# Patient Record
Sex: Male | Born: 1975 | Race: White | Hispanic: No | Marital: Single | Smoking: Current every day smoker
Health system: Southern US, Community
[De-identification: ages and names within clinical notes are randomized; demographics above are authoritative.]

## PROBLEM LIST (undated history)

## (undated) DIAGNOSIS — I499 Cardiac arrhythmia, unspecified: Secondary | ICD-10-CM

## (undated) DIAGNOSIS — J189 Pneumonia, unspecified organism: Secondary | ICD-10-CM

---

## 2020-03-17 ENCOUNTER — Other Ambulatory Visit: Payer: Self-pay | Admitting: Family Medicine

## 2020-03-17 ENCOUNTER — Other Ambulatory Visit: Payer: Self-pay | Admitting: *Deleted

## 2020-03-17 DIAGNOSIS — R160 Hepatomegaly, not elsewhere classified: Secondary | ICD-10-CM

## 2020-03-20 ENCOUNTER — Ambulatory Visit
Admission: RE | Admit: 2020-03-20 | Discharge: 2020-03-20 | Disposition: A | Discharge status: Home / Self Care | Payer: Managed Care, Other (non HMO) | Source: Ambulatory Visit | Attending: Family Medicine | Admitting: Family Medicine

## 2020-03-20 ENCOUNTER — Other Ambulatory Visit: Payer: Self-pay

## 2020-03-20 DIAGNOSIS — R160 Hepatomegaly, not elsewhere classified: Secondary | ICD-10-CM | POA: Diagnosis present

## 2020-03-26 ENCOUNTER — Telehealth: Payer: Managed Care, Other (non HMO)

## 2020-04-01 ENCOUNTER — Other Ambulatory Visit: Payer: Self-pay | Admitting: Family Medicine

## 2020-04-01 DIAGNOSIS — R932 Abnormal findings on diagnostic imaging of liver and biliary tract: Secondary | ICD-10-CM

## 2020-04-07 ENCOUNTER — Ambulatory Visit
Admission: RE | Admit: 2020-04-07 | Discharge: 2020-04-07 | Disposition: A | Discharge status: Home / Self Care | Payer: Managed Care, Other (non HMO) | Source: Ambulatory Visit | Attending: Family Medicine | Admitting: Family Medicine

## 2020-04-07 ENCOUNTER — Other Ambulatory Visit: Payer: Self-pay

## 2020-04-07 DIAGNOSIS — R932 Abnormal findings on diagnostic imaging of liver and biliary tract: Secondary | ICD-10-CM | POA: Insufficient documentation

## 2021-04-07 ENCOUNTER — Telehealth: Payer: Self-pay

## 2021-04-07 ENCOUNTER — Other Ambulatory Visit (INDEPENDENT_AMBULATORY_CARE_PROVIDER_SITE_OTHER): Payer: Self-pay

## 2021-04-07 DIAGNOSIS — Z1211 Encounter for screening for malignant neoplasm of colon: Secondary | ICD-10-CM

## 2021-04-07 MED ORDER — CLENPIQ 10-3.5-12 MG-GM -GM/160ML PO SOLN: 1.0000 | ORAL | 0 refills | Status: DC

## 2021-04-07 NOTE — Progress Notes (Signed)
Gastroenterology Pre-Procedure Review  Request Date: 04/26/2021 Requesting Physician: Dr. Bonna Gains  PATIENT REVIEW QUESTIONS: The patient responded to the following health history questions as indicated:    1. Are you having any GI issues? yes (BLOATING SOFT STOOLS) 2. Do you have a personal history of Polyps? no 3. Do you have a family history of Colon Cancer or Polyps? yes (MOM AUNTS AND UNCLES ON MOMS SIDE) 4. Diabetes Mellitus? no 5. Joint replacements in the past 12 months?no 6. Major health problems in the past 3 months?no 7. Any artificial heart valves, MVP, or defibrillator?no    MEDICATIONS & ALLERGIES:    Patient reports the following regarding taking any anticoagulation/antiplatelet therapy:   Plavix, Coumadin, Eliquis, Xarelto, Lovenox, Pradaxa, Brilinta, or Effient? no Aspirin? no  Patient confirms/reports the following medications:  No current outpatient medications on file.   No current facility-administered medications for this visit.    Patient confirms/reports the following allergies:  No Known Allergies  No orders of the defined types were placed in this encounter.   AUTHORIZATION INFORMATION Primary Insurance: 1D#: Group #:  Secondary Insurance: 1D#: Group #:  SCHEDULE INFORMATION: Date: 04/26/2021 Time: Location:

## 2021-04-07 NOTE — Telephone Encounter (Signed)
CALLED NO ANSWER LEFT VOICEMAIL FOR A CALL BACK 

## 2021-04-08 ENCOUNTER — Other Ambulatory Visit: Payer: Self-pay

## 2021-04-08 MED ORDER — CLENPIQ 10-3.5-12 MG-GM -GM/160ML PO SOLN: 1.0000 | ORAL | 0 refills | Status: DC

## 2021-04-08 NOTE — Progress Notes (Signed)
Patient called to reschedule coloscopy till 05/10/2021 at [facility] and with vanga

## 2021-05-10 ENCOUNTER — Encounter: Payer: Self-pay | Admitting: Anesthesiology

## 2021-05-10 ENCOUNTER — Encounter
Admission: RE | Disposition: A | Discharge status: Home / Self Care | Payer: Self-pay | Source: Home / Self Care | Attending: Gastroenterology

## 2021-05-10 ENCOUNTER — Ambulatory Visit
Admission: RE | Admit: 2021-05-10 | Discharge: 2021-05-10 | Disposition: A | Discharge status: Home / Self Care | Payer: 59 | Attending: Gastroenterology | Admitting: Gastroenterology

## 2021-05-10 ENCOUNTER — Encounter: Payer: Self-pay | Admitting: Gastroenterology

## 2021-05-10 ENCOUNTER — Other Ambulatory Visit: Payer: Self-pay

## 2021-05-10 DIAGNOSIS — K635 Polyp of colon: Secondary | ICD-10-CM | POA: Diagnosis not present

## 2021-05-10 DIAGNOSIS — Z79899 Other long term (current) drug therapy: Secondary | ICD-10-CM | POA: Insufficient documentation

## 2021-05-10 DIAGNOSIS — D3610 Benign neoplasm of peripheral nerves and autonomic nervous system, unspecified: Secondary | ICD-10-CM | POA: Diagnosis not present

## 2021-05-10 DIAGNOSIS — F17210 Nicotine dependence, cigarettes, uncomplicated: Secondary | ICD-10-CM | POA: Insufficient documentation

## 2021-05-10 DIAGNOSIS — D126 Benign neoplasm of colon, unspecified: Secondary | ICD-10-CM | POA: Diagnosis not present

## 2021-05-10 DIAGNOSIS — Z1211 Encounter for screening for malignant neoplasm of colon: Secondary | ICD-10-CM

## 2021-05-10 DIAGNOSIS — Z7901 Long term (current) use of anticoagulants: Secondary | ICD-10-CM | POA: Diagnosis not present

## 2021-05-10 DIAGNOSIS — K621 Rectal polyp: Secondary | ICD-10-CM | POA: Diagnosis not present

## 2021-05-10 HISTORY — DX: Cardiac arrhythmia, unspecified: I49.9

## 2021-05-10 HISTORY — PX: COLONOSCOPY WITH PROPOFOL: SHX5780

## 2021-05-10 HISTORY — DX: Pneumonia, unspecified organism: J18.9

## 2021-05-10 SURGERY — COLONOSCOPY WITH PROPOFOL
Anesthesia: General

## 2021-05-10 MED ORDER — LIDOCAINE HCL (CARDIAC) PF 100 MG/5ML IV SOSY
PREFILLED_SYRINGE | INTRAVENOUS | Status: DC | PRN
  Administered 2021-05-10: 50 mg via INTRAVENOUS

## 2021-05-10 MED ORDER — PROPOFOL 500 MG/50ML IV EMUL
INTRAVENOUS | Status: DC | PRN
  Administered 2021-05-10: 175 ug/kg/min via INTRAVENOUS

## 2021-05-10 MED ORDER — PHENYLEPHRINE HCL (PRESSORS) 10 MG/ML IV SOLN
INTRAVENOUS | Status: AC
  Filled 2021-05-10: qty 1

## 2021-05-10 MED ORDER — DEXMEDETOMIDINE (PRECEDEX) IN NS 20 MCG/5ML (4 MCG/ML) IV SYRINGE
PREFILLED_SYRINGE | INTRAVENOUS | Status: DC | PRN
  Administered 2021-05-10: 12 ug via INTRAVENOUS

## 2021-05-10 MED ORDER — PROPOFOL 10 MG/ML IV BOLUS
INTRAVENOUS | Status: DC | PRN
  Administered 2021-05-10: 90 mg via INTRAVENOUS
  Administered 2021-05-10: 50 mg via INTRAVENOUS

## 2021-05-10 MED ORDER — LIDOCAINE HCL (PF) 2 % IJ SOLN
INTRAMUSCULAR | Status: AC
  Filled 2021-05-10: qty 5

## 2021-05-10 MED ORDER — DEXMEDETOMIDINE HCL IN NACL 200 MCG/50ML IV SOLN
INTRAVENOUS | Status: AC
  Filled 2021-05-10: qty 50

## 2021-05-10 MED ORDER — MIDAZOLAM HCL 2 MG/2ML IJ SOLN
INTRAMUSCULAR | Status: DC | PRN
  Administered 2021-05-10: 1 mg via INTRAVENOUS

## 2021-05-10 MED ORDER — PROPOFOL 500 MG/50ML IV EMUL
INTRAVENOUS | Status: AC
  Filled 2021-05-10: qty 50

## 2021-05-10 MED ORDER — METOPROLOL TARTRATE 5 MG/5ML IV SOLN
INTRAVENOUS | Status: AC
  Filled 2021-05-10: qty 5

## 2021-05-10 MED ORDER — SODIUM CHLORIDE 0.9 % IV SOLN: INTRAVENOUS | Status: DC

## 2021-05-10 MED ORDER — MIDAZOLAM HCL 2 MG/2ML IJ SOLN
INTRAMUSCULAR | Status: AC
  Filled 2021-05-10: qty 2

## 2021-05-10 MED ORDER — METOPROLOL TARTRATE 5 MG/5ML IV SOLN
INTRAVENOUS | Status: DC | PRN
  Administered 2021-05-10: 2 mg via INTRAVENOUS

## 2021-05-10 MED ORDER — PHENYLEPHRINE HCL (PRESSORS) 10 MG/ML IV SOLN
INTRAVENOUS | Status: DC | PRN
Start: 2021-05-10 — End: 2021-05-10
  Administered 2021-05-10: 200 ug via INTRAVENOUS
  Administered 2021-05-10: 100 ug via INTRAVENOUS
  Administered 2021-05-10: 200 ug via INTRAVENOUS

## 2021-05-10 NOTE — Op Note (Signed)
[facility] Gastroenterology Patient Name: John Newman Procedure Date: 05/10/2021 7:47 AM MRN: [id number] Account #: [id number] Date of Birth: 09/04/1975 Admit Type: Outpatient Age: 45 Room: [facility] ENDO ROOM 1 Gender: Male Note Status: Finalized Instrument Name: Park [id number] [id number] Procedure:             Colonoscopy Indications:           Screening for colorectal malignant neoplasm, This is                         the patient's first colonoscopy Providers:             Lin Landsman MD, MD Medicines:             General Anesthesia Complications:         No immediate complications. Estimated blood loss: None. Procedure:             Pre-Anesthesia Assessment:                        - Prior to the procedure, a History and Physical was                         performed, and patient medications and allergies were                         reviewed. The patient is competent. The risks and                         benefits of the procedure and the sedation options and                         risks were discussed with the patient. All questions                         were answered and informed consent was obtained.                         Patient identification and proposed procedure were                         verified by the physician, the nurse, the                         anesthesiologist, the anesthetist and the technician                         in the pre-procedure area in the procedure room in the                         endoscopy suite. Mental Status Examination: alert and                         oriented. Airway Examination: normal oropharyngeal                         airway and neck mobility. Respiratory Examination:                         clear to auscultation.  CV Examination: normal.                         Prophylactic Antibiotics: The patient does not require                         prophylactic antibiotics. Prior Anticoagulants: The                          patient has taken Eliquis (apixaban), last dose was 2                         days prior to procedure. ASA Grade Assessment: III - A                         patient with severe systemic disease. After reviewing                         the risks and benefits, the patient was deemed in                         satisfactory condition to undergo the procedure. The                         anesthesia plan was to use general anesthesia.                         Immediately prior to administration of medications,                         the patient was re-assessed for adequacy to receive                         sedatives. The heart rate, respiratory rate, oxygen                         saturations, blood pressure, adequacy of pulmonary                         ventilation, and response to care were monitored                         throughout the procedure. The physical status of the                         patient was re-assessed after the procedure.                        After obtaining informed consent, the colonoscope was                         passed under direct vision. Throughout the procedure,                         the patient's blood pressure, pulse, and oxygen                         saturations were monitored continuously. The  Colonoscope was introduced through the anus and                         advanced to the the cecum, identified by appendiceal                         orifice and ileocecal valve. The colonoscopy was                         performed with moderate difficulty due to significant                         looping and the patient's body habitus. Successful                         completion of the procedure was aided by applying                         abdominal pressure. The patient tolerated the                         procedure well. The quality of the bowel preparation                         was evaluated using the BBPS [facility] Bowel  Preparation                         Scale) with scores of: Right Colon = 3, Transverse                         Colon = 3 and Left Colon = 3 (entire mucosa seen well                         with no residual staining, small fragments of stool or                         opaque liquid). The total BBPS score equals 9. Findings:      The perianal and digital rectal examinations were normal. Pertinent       negatives include normal sphincter tone and no palpable rectal lesions.      A 10 mm polyp was found in the ascending colon. The polyp was sessile.       The polyp was removed with a hot snare. Resection and retrieval were       complete.      A 5 mm polyp was found in the transverse colon. The polyp was sessile.       The polyp was removed with a cold snare. Resection and retrieval were       complete.      Three sessile polyps were found in the rectum. The polyps were 3 to 5 mm       in size. These polyps were removed with a cold snare. Resection and       retrieval were complete.      The retroflexed view of the distal rectum and anal verge was normal and       showed no anal or rectal abnormalities. Impression:            -  One 10 mm polyp in the ascending colon, removed with                         a hot snare. Resected and retrieved.                        - One 5 mm polyp in the transverse colon, removed with                         a cold snare. Resected and retrieved.                        - Three 3 to 5 mm polyps in the rectum, removed with a                         cold snare. Resected and retrieved.                        - The distal rectum and anal verge are normal on                         retroflexion view. Recommendation:        - Discharge patient to home (with escort).                        - Resume previous diet today.                        - Continue present medications.                        - Await pathology results.                        - Repeat colonoscopy  in 5 years for surveillance.                        - Resume Eliquis (apixaban) in 2 days at prior dose.                         Refer to managing physician for further adjustment of                         therapy. Procedure Code(s):     --- Professional ---                        [phone], Colonoscopy, flexible; with removal of                         tumor(s), polyp(s), or other lesion(s) by snare                         technique Diagnosis Code(s):     --- Professional ---                        Z12.11, Encounter for screening for malignant neoplasm  of colon                        K63.5, Polyp of colon                        K62.1, Rectal polyp CPT copyright 2019 American Medical Association. All rights reserved. The codes documented in this report are preliminary and upon coder review may  be revised to meet current compliance requirements. Dr. Ulyess Mort Lin Landsman MD, MD 05/10/2021 8:36:06 AM This report has been signed electronically. Number of Addenda: 0 Note Initiated On: 05/10/2021 7:47 AM Scope Withdrawal Time: 0 hours 31 minutes 0 seconds  Total Procedure Duration: 0 hours 38 minutes 39 seconds  Estimated Blood Loss:  Estimated blood loss: none.      [facility]

## 2021-05-10 NOTE — Anesthesia Procedure Notes (Signed)
Date/Time: 05/10/2021 7:48 AM Performed by: Johnna Acosta, CRNA Pre-anesthesia Checklist: Patient identified, Emergency Drugs available, Suction available, Patient being monitored and Timeout performed Patient Re-evaluated:Patient Re-evaluated prior to induction Oxygen Delivery Method: Nasal cannula Preoxygenation: Pre-oxygenation with 100% oxygen Induction Type: IV induction

## 2021-05-10 NOTE — H&P (Signed)
Cephas Darby, MD [address]  [place]  [place], [place] [address]  Main: [phone]  Fax: [phone] Pager: [phone]  Primary Care Physician:  Sofie Hartigan, MD Primary Gastroenterologist:  Dr. Cephas Darby  Pre-Procedure History & Physical: HPI:  John Newman is a 45 y.o. male is here for an colonoscopy.   Past Medical History:  Diagnosis Date   Dysrhythmia    Pneumonia     History reviewed. No pertinent surgical history.  Prior to Admission medications   Medication Sig Start Date End Date Taking? Authorizing Provider  apixaban (ELIQUIS) 5 MG TABS tablet Take 5 mg by mouth 2 (two) times daily.   Yes [provider]  benzonatate (TESSALON) 100 MG capsule Take 100 mg by mouth 3 (three) times daily as needed for cough.   Yes [provider]  cefUROXime (CEFTIN) 500 MG tablet Take 500 mg by mouth 2 (two) times daily with a meal.   Yes [provider]  metoprolol succinate (TOPROL-XL) 25 MG 24 hr tablet Take 25 mg by mouth daily.   Yes [provider]  Sod Picosulfate-Mag Ox-Cit Acd (CLENPIQ) 10-3.5-12 MG-GM -GM/160ML SOLN Take 1 kit by mouth as directed. At 5 PM evening before procedure, drink 1 bottle of Clenpiq, hydrate, drink (5) 8 oz of water. Then do the same thing 5 hours prior to your procedure. 04/07/21   Virgel Manifold, MD  Sod Picosulfate-Mag Ox-Cit Acd (CLENPIQ) 10-3.5-12 MG-GM -GM/160ML SOLN Take 1 kit by mouth as directed. At 5 PM evening before procedure, drink 1 bottle of Clenpiq, hydrate, drink (5) 8 oz of water. Then do the same thing 5 hours prior to your procedure. 04/08/21   Lin Landsman, MD    Allergies as of 04/07/2021   (No Known Allergies)    History reviewed. No pertinent family history.  Social History   Socioeconomic History   Marital status: Single    Spouse name: Not on file   Number of children: Not on file   Years of education: Not on file   Highest education level: Not on  file  Occupational History   Not on file  Tobacco Use   Smoking status: Every Day    Packs/day: 1.50    Types: Cigarettes   Smokeless tobacco: Never  Vaping Use   Vaping Use: Never used  Substance and Sexual Activity   Alcohol use: Yes    Alcohol/week: 10.0 standard drinks    Types: 10 Shots of liquor per week    Comment: Nightly   Drug use: Not Currently    Types: Marijuana   Sexual activity: Not on file  Other Topics Concern   Not on file  Social History Narrative   Not on file   Social Determinants of Health   Financial Resource Strain: Not on file  Food Insecurity: Not on file  Transportation Needs: Not on file  Physical Activity: Not on file  Stress: Not on file  Social Connections: Not on file  Intimate Partner Violence: Not on file    Review of Systems: See HPI, otherwise negative ROS  Physical Exam: BP (!) 141/109   Pulse 100   Temp 98.4 F (36.9 C) (Temporal)   Resp 17   Ht '5\' 11"'  (1.803 m)   Wt (!) 139.7 kg   SpO2 99%   BMI 42.96 kg/m  General:   Alert,  pleasant and cooperative in NAD Head:  Normocephalic and atraumatic. Neck:  Supple; no masses or thyromegaly.  Lungs:  Clear throughout to auscultation.    Heart:  Regular rate and rhythm. Abdomen:  Soft, nontender and nondistended. Normal bowel sounds, without guarding, and without rebound.   Neurologic:  Alert and  oriented x4;  grossly normal neurologically.  Impression/Plan: Fabrizio Filip is here for an colonoscopy to be performed for colon cancer screening  Risks, benefits, limitations, and alternatives regarding  colonoscopy have been reviewed with the patient.  Questions have been answered.  All parties agreeable.   Sherri Sear, MD  05/10/2021, 7:40 AM

## 2021-05-10 NOTE — Anesthesia Preprocedure Evaluation (Signed)
Anesthesia Evaluation  Patient identified by MRN, date of birth, ID band Patient awake    Reviewed: Allergy & Precautions, H&P , NPO status , Patient's Chart, lab work & pertinent test results, reviewed documented beta blocker date and time   Airway Mallampati: II   Neck ROM: full    Dental  (+) Teeth Intact   Pulmonary pneumonia, unresolved, Current Smoker,    Pulmonary exam normal        Cardiovascular Exercise Tolerance: Good + dysrhythmias Atrial Fibrillation  Rhythm:regular Rate:Normal     Neuro/Psych negative neurological ROS  negative psych ROS   GI/Hepatic negative GI ROS, Neg liver ROS,   Endo/Other  negative endocrine ROS  Renal/GU negative Renal ROS  negative genitourinary   Musculoskeletal   Abdominal   Peds  Hematology negative hematology ROS (+)   Anesthesia Other Findings Past Medical History: No date: Dysrhythmia No date: Pneumonia History reviewed. No pertinent surgical history.   Reproductive/Obstetrics negative OB ROS                             Anesthesia Physical Anesthesia Plan  ASA: 3  Anesthesia Plan: General   Post-op Pain Management:    Induction:   PONV Risk Score and Plan:   Airway Management Planned:   Additional Equipment:   Intra-op Plan:   Post-operative Plan:   Informed Consent: I have reviewed the patients History and Physical, chart, labs and discussed the procedure including the risks, benefits and alternatives for the proposed anesthesia with the patient or authorized representative who has indicated his/her understanding and acceptance.     Dental Advisory Given  Plan Discussed with: CRNA  Anesthesia Plan Comments:         Anesthesia Quick Evaluation

## 2021-05-10 NOTE — Anesthesia Postprocedure Evaluation (Signed)
Anesthesia Post Note  Patient: John Newman  Procedure(s) Performed: COLONOSCOPY WITH PROPOFOL  Patient location during evaluation: PACU Anesthesia Type: General Level of consciousness: awake and alert Pain management: pain level controlled Vital Signs Assessment: post-procedure vital signs reviewed and stable Respiratory status: spontaneous breathing, nonlabored ventilation, respiratory function stable and patient connected to nasal cannula oxygen Cardiovascular status: blood pressure returned to baseline and stable Postop Assessment: no apparent nausea or vomiting Anesthetic complications: no   No notable events documented.   Last Vitals:  Vitals:   05/10/21 0843 05/10/21 0851  BP: 94/79 107/80  Pulse: (!) 103 94  Resp: 12 19  Temp:    SpO2: 96% 96%    Last Pain:  Vitals:   05/10/21 0851  TempSrc:   PainSc: 0-No pain                 Molli Barrows

## 2021-05-10 NOTE — Transfer of Care (Signed)
Immediate Anesthesia Transfer of Care Note  Patient: John Newman  Procedure(s) Performed: COLONOSCOPY WITH PROPOFOL  Patient Location: PACU  Anesthesia Type:General  Level of Consciousness: awake, drowsy and patient cooperative  Airway & Oxygen Therapy: Patient Spontanous Breathing and Patient connected to nasal cannula oxygen  Post-op Assessment: Report given to RN and Post -op Vital signs reviewed and stable  Post vital signs: Reviewed and stable  Last Vitals:  Vitals Value Taken Time  BP 94/79 05/10/21 0843  Temp 36 C 05/10/21 0841  Pulse 103 05/10/21 0843  Resp 12 05/10/21 0843  SpO2 96 % 05/10/21 0843    Last Pain:  Vitals:   05/10/21 0841  TempSrc: Tympanic  PainSc: Asleep         Complications: No notable events documented.

## 2021-05-11 ENCOUNTER — Encounter: Payer: Self-pay | Admitting: Gastroenterology

## 2021-05-12 ENCOUNTER — Encounter: Payer: Self-pay | Admitting: Gastroenterology

## 2021-05-12 LAB — SURGICAL PATHOLOGY

## 2021-05-19 ENCOUNTER — Telehealth: Payer: Self-pay

## 2021-05-19 DIAGNOSIS — K625 Hemorrhage of anus and rectum: Secondary | ICD-10-CM

## 2021-05-19 LAB — CBC
Hematocrit: 43 % (ref 37.5–51.0)
Hemoglobin: 15 g/dL (ref 13.0–17.7)
MCH: 32.5 pg (ref 26.6–33.0)
MCHC: 34.9 g/dL (ref 31.5–35.7)
MCV: 93 fL (ref 79–97)
Platelets: 288 10*3/uL (ref 150–450)
RBC: 4.62 x10E6/uL (ref 4.14–5.80)
RDW: 12.1 % (ref 11.6–15.4)
WBC: 12.3 10*3/uL — ABNORMAL HIGH (ref 3.4–10.8)

## 2021-05-19 NOTE — Telephone Encounter (Signed)
-----   Message from Lin Landsman, MD sent at 05/19/2021  2:52 PM EDT ----- Caryl Pina  Please inform patient that his hemoglobin is still normal at 15, but dropped from his baseline which was 16.5 in August.  I would like him to stay on clear liquid diet today, and go ahead and schedule colonoscopy tomorrow if he is agreeable.  I am concerned about bleeding from the polypectomy site in his right colon.  If you would like to give next 24 hours, take MiraLAX twice daily and see if his stools clear up, I am okay with that too

## 2021-05-19 NOTE — Telephone Encounter (Signed)
Patient left a voicemail stating the had a colonoscopy on 05/10/2021. He states he was told if he began to have rectal bleeding to call back. He states he been having rectal bleeding. Called and left a message for call back to get more information

## 2021-05-19 NOTE — Telephone Encounter (Signed)
Please ask him not to take eliquis, and let's check CBC today. If his Hb is low, he will need colonoscopy  RV

## 2021-05-19 NOTE — Telephone Encounter (Signed)
He states he had rectal bleeding on 05/10/2021 after his procedure but it stopped the next day. He states he began to have rectal bleeding yesterday. He states he had 5 to 6 bowel movements yesterday all with rectal bleeding. He states that the bleeding was in the toilet and on the toilet paper.  He states the stools are black and the blood is darker red. He is still taking his Eliquis. Patient states on Monday she had a big bowel movement and then after that he began to have diarrhea on Tuesday.

## 2021-05-19 NOTE — Telephone Encounter (Signed)
Patient has not taken Eliquis today yet. He states he will not take it today. He will come to our office today to have the labs done as soon as he can

## 2021-05-19 NOTE — Telephone Encounter (Signed)
Patient verbalized understanding of results. He states he can not do tomorrow because he knows he does not have a ride for tomorrow. He is going to call his brother today and find out if he can take him on Friday. He is not going to schedule anything at this time he going to see how he is in the morning. He will call us first thing in the morning to let us know what he wants to do. He said he might have to wait till next week due to transportation.

## 2021-05-19 NOTE — Addendum Note (Signed)
Addended by: Ulyess Blossom L on: 05/19/2021 08:57 AM   Modules accepted: Orders

## 2021-05-20 NOTE — Telephone Encounter (Signed)
Patient's should restart taking Eliquis after his stool completely turns brown  RV

## 2021-05-20 NOTE — Telephone Encounter (Signed)
Patient states he does not have any rectal bleeding today. He states he does not want to have colonoscopy now since it has improved. Patient asked when he should start taking the Eliquis again

## 2021-05-20 NOTE — Telephone Encounter (Signed)
Patient states that he just woke up and so far he has had no rectal bleeding. He states he just have some abdominal bloating. He states he is going to let us know early afternoon if bleeding begins. He states he is going to keep on clear liquids today incase he has to have a colonoscopy tomorrow.

## 2021-05-21 ENCOUNTER — Other Ambulatory Visit: Payer: Self-pay

## 2021-05-21 DIAGNOSIS — K625 Hemorrhage of anus and rectum: Secondary | ICD-10-CM

## 2021-05-21 MED ORDER — NA SULFATE-K SULFATE-MG SULF 17.5-3.13-1.6 GM/177ML PO SOLN: 354.0000 mL | Freq: Once | ORAL | 0 refills | Status: AC

## 2021-05-21 NOTE — Addendum Note (Signed)
Addended by: Ulyess Blossom L on: 05/21/2021 09:36 AM   Modules accepted: Orders

## 2021-05-21 NOTE — Addendum Note (Signed)
Addended by: Ulyess Blossom L on: 05/21/2021 08:23 AM   Modules accepted: Orders

## 2021-05-21 NOTE — Telephone Encounter (Signed)
Patient verbalized of instructions. He will go get blood work done today

## 2021-05-21 NOTE — Telephone Encounter (Signed)
When I called patient this morning he stated that he began to have rectal bleeding last night and this morning. He said it is not as bad as it was but still some. Schedule patient for colonoscopy on Monday with you

## 2021-05-22 LAB — CBC
Hematocrit: 38.7 % (ref 37.5–51.0)
Hemoglobin: 13.3 g/dL (ref 13.0–17.7)
MCH: 32.8 pg (ref 26.6–33.0)
MCHC: 34.4 g/dL (ref 31.5–35.7)
MCV: 95 fL (ref 79–97)
Platelets: 276 10*3/uL (ref 150–450)
RBC: 4.06 x10E6/uL — ABNORMAL LOW (ref 4.14–5.80)
RDW: 11.8 % (ref 11.6–15.4)
WBC: 9.5 10*3/uL (ref 3.4–10.8)

## 2021-05-24 ENCOUNTER — Ambulatory Visit
Admission: RE | Admit: 2021-05-24 | Discharge: 2021-05-24 | Disposition: A | Discharge status: Home / Self Care | Payer: 59 | Attending: Gastroenterology | Admitting: Gastroenterology

## 2021-05-24 ENCOUNTER — Encounter
Admission: RE | Disposition: A | Discharge status: Home / Self Care | Payer: Self-pay | Source: Home / Self Care | Attending: Gastroenterology

## 2021-05-24 ENCOUNTER — Ambulatory Visit: Payer: 59 | Admitting: Anesthesiology

## 2021-05-24 DIAGNOSIS — K625 Hemorrhage of anus and rectum: Secondary | ICD-10-CM | POA: Diagnosis not present

## 2021-05-24 DIAGNOSIS — Z7901 Long term (current) use of anticoagulants: Secondary | ICD-10-CM | POA: Diagnosis not present

## 2021-05-24 DIAGNOSIS — F17210 Nicotine dependence, cigarettes, uncomplicated: Secondary | ICD-10-CM | POA: Insufficient documentation

## 2021-05-24 DIAGNOSIS — K91840 Postprocedural hemorrhage and hematoma of a digestive system organ or structure following a digestive system procedure: Secondary | ICD-10-CM | POA: Diagnosis not present

## 2021-05-24 DIAGNOSIS — Z79899 Other long term (current) drug therapy: Secondary | ICD-10-CM | POA: Diagnosis not present

## 2021-05-24 DIAGNOSIS — K621 Rectal polyp: Secondary | ICD-10-CM | POA: Diagnosis not present

## 2021-05-24 DIAGNOSIS — K633 Ulcer of intestine: Secondary | ICD-10-CM | POA: Diagnosis not present

## 2021-05-24 HISTORY — PX: COLONOSCOPY WITH PROPOFOL: SHX5780

## 2021-05-24 SURGERY — COLONOSCOPY WITH PROPOFOL
Anesthesia: Monitor Anesthesia Care

## 2021-05-24 MED ORDER — PROPOFOL 10 MG/ML IV BOLUS
INTRAVENOUS | Status: DC | PRN
  Administered 2021-05-24 (×4): 50 mg via INTRAVENOUS
  Administered 2021-05-24: 130 mg via INTRAVENOUS
  Administered 2021-05-24: 40 mg via INTRAVENOUS
  Administered 2021-05-24: 50 mg via INTRAVENOUS
  Administered 2021-05-24: 80 mg via INTRAVENOUS

## 2021-05-24 MED ORDER — SODIUM CHLORIDE 0.9 % IV SOLN
INTRAVENOUS | Status: DC
  Administered 2021-05-24: 1000 mL via INTRAVENOUS

## 2021-05-24 MED ORDER — ESMOLOL HCL 100 MG/10ML IV SOLN
INTRAVENOUS | Status: DC | PRN
  Administered 2021-05-24 (×2): 10 mg via INTRAVENOUS

## 2021-05-24 NOTE — Op Note (Signed)
[facility] Patient Name: John Newman Procedure Date: 05/24/2021 11:43 AM MRN: [id number] Account #: [id number] Date of Birth: November 30, 1975 Admit Type: Outpatient Age: 45 Room: [facility] ENDO ROOM 1 Gender: Male Note Status: Finalized Instrument Name: Park Meo [id number] Procedure:             Colonoscopy Indications:           Last colonoscopy one week ago, Treatment of bleeding                         from polypectomy site Providers:             Lin Landsman MD, MD Referring MD:          Sofie Hartigan (Referring MD) Medicines:             General Anesthesia Complications:         No immediate complications. Estimated blood loss: None. Procedure:             Pre-Anesthesia Assessment:                        - Prior to the procedure, a History and Physical was                         performed, and patient medications and allergies were                         reviewed. The patient is competent. The risks and                         benefits of the procedure and the sedation options and                         risks were discussed with the patient. All questions                         were answered and informed consent was obtained.                         Patient identification and proposed procedure were                         verified by the physician, the nurse, the                         anesthesiologist, the anesthetist and the technician                         in the pre-procedure area in the procedure room in the                         endoscopy suite. Mental Status Examination: alert and                         oriented. Airway Examination: normal oropharyngeal                         airway and neck mobility. Respiratory Examination:  clear to auscultation. CV Examination: irregularly                         irregular rate and rhythm. Prophylactic Antibiotics:                         The patient does not  require prophylactic antibiotics.                         Prior Anticoagulants: The patient has taken Eliquis                         (apixaban), last dose was 6 days prior to procedure.                         ASA Grade Assessment: III - A patient with severe                         systemic disease. After reviewing the risks and                         benefits, the patient was deemed in satisfactory                         condition to undergo the procedure. The anesthesia                         plan was to use general anesthesia. Immediately prior                         to administration of medications, the patient was                         re-assessed for adequacy to receive sedatives. The                         heart rate, respiratory rate, oxygen saturations,                         blood pressure, adequacy of pulmonary ventilation, and                         response to care were monitored throughout the                         procedure. The physical status of the patient was                         re-assessed after the procedure.                        After obtaining informed consent, the colonoscope was                         passed under direct vision. Throughout the procedure,                         the patient's blood pressure, pulse, and oxygen  saturations were monitored continuously. The                         Colonoscope was introduced through the anus and                         advanced to the the cecum, identified by appendiceal                         orifice and ileocecal valve. The colonoscopy was                         unusually difficult due to significant looping and the                         patient's body habitus. Successful completion of the                         procedure was aided by applying abdominal pressure.                         The patient tolerated the procedure well. The quality                         of the  bowel preparation was evaluated using the BBPS                         [facility] Bowel Preparation Scale) with scores of: Right                         Colon = 3, Transverse Colon = 3 and Left Colon = 3                         (entire mucosa seen well with no residual staining,                         small fragments of stool or opaque liquid). The total                         BBPS score equals 9. Findings:      The perianal and digital rectal examinations were normal. Pertinent       negatives include normal sphincter tone and no palpable rectal lesions.      Light yellow liquid in the colon      A single (solitary) six mm ulcer was found in the proximal ascending       colon. No bleeding was present. Stigmata of recent bleeding were       present. To prevent bleeding after the polypectomy, one hemostatic clip       was successfully placed (MR conditional). There was no bleeding at the       end of the procedure. Fulguration to stop the bleeding by bipolar probe       was successful. Estimated blood loss: none.      Three sessile polyps were found in the rectum. The polyps were small in       size. These polyps were removed with a cold snare. Resection and       retrieval  were complete.      The retroflexed view of the distal rectum and anal verge was normal and       showed no anal or rectal abnormalities. Impression:            - A single (solitary) ulcer in the proximal ascending                         colon. Clip (MR conditional) was placed. Treated with                         bipolar cautery.                        - Three small polyps in the rectum, removed with a                         cold snare. Resected and retrieved.                        - The distal rectum and anal verge are normal on                         retroflexion view. Recommendation:        - Discharge patient to home (with escort).                        - Resume previous diet today.                        -  Continue present medications.                        - Resume Eliquis (apixaban) in 2 days at prior dose.                         Refer to managing physician for further adjustment of                         therapy.                        - Await pathology results. Procedure Code(s):     --- Professional ---                        [phone], 59, Colonoscopy, flexible; with control of                         bleeding, any method                        [id number], Colonoscopy, flexible; with removal of                         tumor(s), polyp(s), or other lesion(s) by snare                         technique Diagnosis Code(s):     --- Professional ---  K63.3, Ulcer of intestine                        K62.1, Rectal polyp                        K91.840, Postprocedural hemorrhage of a digestive                         system organ or structure following a digestive system                         procedure CPT copyright 2019 American [employer] Association. All rights reserved. The codes documented in this report are preliminary and upon coder review may  be revised to meet current compliance requirements. Dr. Ulyess Mort Lin Landsman MD, MD 05/24/2021 12:35:50 PM This report has been signed electronically. Number of Addenda: 0 Note Initiated On: 05/24/2021 11:43 AM Scope Withdrawal Time: 0 hours 23 minutes 41 seconds  Total Procedure Duration: 0 hours 31 minutes 17 seconds  Estimated Blood Loss:  Estimated blood loss: none.      [facility]

## 2021-05-24 NOTE — Anesthesia Postprocedure Evaluation (Signed)
Anesthesia Post Note  Patient: John Newman  Procedure(s) Performed: COLONOSCOPY WITH PROPOFOL  Patient location during evaluation: PACU Anesthesia Type: MAC Level of consciousness: awake and alert Pain management: pain level controlled Vital Signs Assessment: post-procedure vital signs reviewed and stable Respiratory status: spontaneous breathing, nonlabored ventilation, respiratory function stable and patient connected to nasal cannula oxygen Cardiovascular status: stable and blood pressure returned to baseline Postop Assessment: no apparent nausea or vomiting Anesthetic complications: no   No notable events documented.   Last Vitals:  Vitals:   05/24/21 1255 05/24/21 1305  BP: (!) 116/93 (!) 128/97  Pulse: 88 84  Resp: 15 15  Temp:    SpO2: 99% 100%    Last Pain:  Vitals:   05/24/21 1305  TempSrc:   PainSc: 0-No pain                 Tonny Bollman

## 2021-05-24 NOTE — H&P (Signed)
John Darby, MD [address]  [place]  [place], [place] [address]  Main: [phone]  Fax: [phone] Pager: [phone]  Primary Care Physician:  Sofie Hartigan, MD Primary Gastroenterologist:  Dr. Cephas Newman  Pre-Procedure History & Physical: HPI:  John Newman is a 45 y.o. male is here for an colonoscopy.   Past Medical History:  Diagnosis Date   Dysrhythmia    Pneumonia     Past Surgical History:  Procedure Laterality Date   COLONOSCOPY WITH PROPOFOL N/A 05/10/2021   Procedure: COLONOSCOPY WITH PROPOFOL;  Surgeon: Lin Landsman, MD;  Location: [facility] ENDOSCOPY;  Service: Endoscopy;  Laterality: N/A;    Prior to Admission medications   Medication Sig Start Date End Date Taking? Authorizing Provider  apixaban (ELIQUIS) 5 MG TABS tablet Take 5 mg by mouth 2 (two) times daily.    [provider]  benzonatate (TESSALON) 100 MG capsule Take 100 mg by mouth 3 (three) times daily as needed for cough.    [provider]  cefUROXime (CEFTIN) 500 MG tablet Take 500 mg by mouth 2 (two) times daily with a meal.    [provider]  metoprolol succinate (TOPROL-XL) 25 MG 24 hr tablet Take 25 mg by mouth daily.    [provider]  Sod Picosulfate-Mag Ox-Cit Acd (CLENPIQ) 10-3.5-12 MG-GM -GM/160ML SOLN Take 1 kit by mouth as directed. At 5 PM evening before procedure, drink 1 bottle of Clenpiq, hydrate, drink (5) 8 oz of water. Then do the same thing 5 hours prior to your procedure. 04/07/21   John Manifold, MD  Sod Picosulfate-Mag Ox-Cit Acd (CLENPIQ) 10-3.5-12 MG-GM -GM/160ML SOLN Take 1 kit by mouth as directed. At 5 PM evening before procedure, drink 1 bottle of Clenpiq, hydrate, drink (5) 8 oz of water. Then do the same thing 5 hours prior to your procedure. 04/08/21   Lin Landsman, MD    Allergies as of 05/21/2021   (No Known Allergies)    No family history on file.  Social History   Socioeconomic History    Marital status: Single    Spouse name: Not on file   Number of children: Not on file   Years of education: Not on file   Highest education level: Not on file  Occupational History   Not on file  Tobacco Use   Smoking status: Every Day    Packs/day: 1.50    Types: Cigarettes   Smokeless tobacco: Never  Vaping Use   Vaping Use: Never used  Substance and Sexual Activity   Alcohol use: Yes    Alcohol/week: 10.0 standard drinks    Types: 10 Shots of liquor per week    Comment: Nightly   Drug use: Not Currently    Types: Marijuana   Sexual activity: Not on file  Other Topics Concern   Not on file  Social History Narrative   Not on file   Social Determinants of Health   Financial Resource Strain: Not on file  Food Insecurity: Not on file  Transportation Needs: Not on file  Physical Activity: Not on file  Stress: Not on file  Social Connections: Not on file  Intimate Partner Violence: Not on file    Review of Systems: See HPI, otherwise negative ROS  Physical Exam: BP 137/90   Temp 97.7 F (36.5 C) (Tympanic)   Resp 17   Ht '5\' 11"'  (1.803 m)   Wt (!) 138.3 kg   SpO2 99%  BMI 42.54 kg/m  General:   Alert,  pleasant and cooperative in NAD Head:  Normocephalic and atraumatic. Neck:  Supple; no masses or thyromegaly. Lungs:  Clear throughout to auscultation.    Heart:  Regular rate and rhythm. Abdomen:  Soft, nontender and nondistended. Normal bowel sounds, without guarding, and without rebound.   Neurologic:  Alert and  oriented x4;  grossly normal neurologically.  Impression/Plan: John Newman is here for an colonoscopy to be performed for rectal bleeding, concern for post polypectomy bleed  Risks, benefits, limitations, and alternatives regarding  colonoscopy have been reviewed with the patient.  Questions have been answered.  All parties agreeable.   Sherri Sear, MD  05/24/2021, 11:46 AM

## 2021-05-24 NOTE — Transfer of Care (Signed)
Immediate Anesthesia Transfer of Care Note  Patient: John Newman  Procedure(s) Performed: COLONOSCOPY WITH PROPOFOL  Patient Location: Endoscopy Unit  Anesthesia Type:MAC  Level of Consciousness: awake, alert  and oriented  Airway & Oxygen Therapy: Patient Spontanous Breathing and Patient connected to nasal cannula oxygen  Post-op Assessment: Report given to RN, Post -op Vital signs reviewed and stable and Patient moving all extremities  Post vital signs: Reviewed and stable  Last Vitals:  Vitals Value Taken Time  BP    Temp    Pulse    Resp    SpO2      Last Pain:  Vitals:   05/24/21 1110  TempSrc: Tympanic         Complications: No notable events documented.

## 2021-05-24 NOTE — Anesthesia Preprocedure Evaluation (Addendum)
Anesthesia Evaluation  Patient identified by MRN, date of birth, ID band Patient awake    Reviewed: Allergy & Precautions, NPO status , Patient's Chart, lab work & pertinent test results  History of Anesthesia Complications Negative for: history of anesthetic complications  Airway Mallampati: II  TM Distance: >3 FB Neck ROM: Full    Dental no notable dental hx. (+) Teeth Intact   Pulmonary pneumonia, resolved, Current Smoker and Patient abstained from smoking.,    Pulmonary exam normal breath sounds clear to auscultation       Cardiovascular Exercise Tolerance: Good METS: 3 - Mets Normal cardiovascular exam+ dysrhythmias (On anticaog) Atrial Fibrillation  Rhythm:Regular Rate:Normal     Neuro/Psych negative neurological ROS  negative psych ROS   GI/Hepatic negative GI ROS, Neg liver ROS,   Endo/Other  negative endocrine ROS  Renal/GU negative Renal ROS  negative genitourinary   Musculoskeletal negative musculoskeletal ROS (+)   Abdominal   Peds  Hematology negative hematology ROS (+)   Anesthesia Other Findings   Reproductive/Obstetrics negative OB ROS                            Anesthesia Physical Anesthesia Plan  ASA: 3  Anesthesia Plan: MAC   Post-op Pain Management:    Induction: Intravenous  PONV Risk Score and Plan:   Airway Management Planned: Natural Airway and Nasal Cannula  Additional Equipment:   Intra-op Plan:   Post-operative Plan:   Informed Consent: I have reviewed the patients History and Physical, chart, labs and discussed the procedure including the risks, benefits and alternatives for the proposed anesthesia with the patient or authorized representative who has indicated his/her understanding and acceptance.     Dental Advisory Given  Plan Discussed with: Anesthesiologist, CRNA and Surgeon  Anesthesia Plan Comments: (Patient consented for risks of  anesthesia including but not limited to:  - adverse reactions to medications - damage to eyes, teeth, lips or other oral mucosa - nerve damage due to positioning  - sore throat or hoarseness - Damage to heart, brain, nerves, lungs, other parts of body or loss of life  Patient voiced understanding.)       Anesthesia Quick Evaluation

## 2021-05-25 ENCOUNTER — Encounter: Payer: Self-pay | Admitting: Gastroenterology

## 2021-05-25 LAB — SURGICAL PATHOLOGY

## 2021-07-13 ENCOUNTER — Encounter
Admission: RE | Disposition: A | Discharge status: Home / Self Care | Payer: Self-pay | Source: Home / Self Care | Attending: Cardiology

## 2021-07-13 ENCOUNTER — Ambulatory Visit: Payer: 59 | Admitting: Certified Registered Nurse Anesthetist

## 2021-07-13 ENCOUNTER — Ambulatory Visit
Admission: RE | Admit: 2021-07-13 | Discharge: 2021-07-13 | Disposition: A | Discharge status: Home / Self Care | Payer: 59 | Attending: Cardiology | Admitting: Cardiology

## 2021-07-13 ENCOUNTER — Other Ambulatory Visit: Payer: Self-pay

## 2021-07-13 ENCOUNTER — Encounter: Payer: Self-pay | Admitting: Cardiology

## 2021-07-13 DIAGNOSIS — I10 Essential (primary) hypertension: Secondary | ICD-10-CM | POA: Diagnosis not present

## 2021-07-13 DIAGNOSIS — I4819 Other persistent atrial fibrillation: Secondary | ICD-10-CM | POA: Insufficient documentation

## 2021-07-13 DIAGNOSIS — F17200 Nicotine dependence, unspecified, uncomplicated: Secondary | ICD-10-CM | POA: Insufficient documentation

## 2021-07-13 HISTORY — PX: CARDIOVERSION: SHX1299

## 2021-07-13 SURGERY — CARDIOVERSION
Anesthesia: General

## 2021-07-13 MED ORDER — SODIUM CHLORIDE 0.9 % IV SOLN: INTRAVENOUS | Status: DC | PRN

## 2021-07-13 MED ORDER — PROPOFOL 10 MG/ML IV BOLUS
INTRAVENOUS | Status: DC | PRN
  Administered 2021-07-13: 80 mg via INTRAVENOUS
  Administered 2021-07-13 (×2): 10 mg via INTRAVENOUS

## 2021-07-13 NOTE — Procedures (Signed)
Electrical Cardioversion Procedure Note John Newman [id number] November 26, 1975  Procedure: Electrical Cardioversion Indications:  Persistant non valvular atrial fibrillation  Procedure Details Consent: Risks of procedure as well as the alternatives and risks of each were explained to the (patient/caregiver).  Consent for procedure obtained. Time Out: Verified patient identification, verified procedure, site/side was marked, verified correct patient position, special equipment/implants available, medications/allergies/relevent history reviewed, required imaging and test results available.  Performed  Patient placed on cardiac monitor, pulse oximetry, supplemental oxygen as necessary.  Sedation given: Propofol and versed as per anesthesia  Pacer pads placed anterior and posterior chest.  Cardioverted 2 time(s).  Cardioverted at [place].  Evaluation Findings: Post procedure EKG shows: NSR Complications: None Patient did tolerate procedure well.   John Angelica, MD 07/13/2021, 7:53 AM

## 2021-07-13 NOTE — Transfer of Care (Signed)
Immediate Anesthesia Transfer of Care Note  Patient: John Newman  Procedure(s) Performed: CARDIOVERSION  Patient Location: PACU  Anesthesia Type:General  Level of Consciousness: drowsy and patient cooperative  Airway & Oxygen Therapy: Patient Spontanous Breathing and Patient connected to nasal cannula oxygen  Post-op Assessment: Report given to RN and Post -op Vital signs reviewed and stable  Post vital signs: Reviewed and stable  Last Vitals:  Vitals Value Taken Time  BP 120/83 07/13/21 0752  Temp    Pulse 75 07/13/21 0740  Resp 18 07/13/21 0752  SpO2 96 % 07/13/21 0752  Vitals shown include unvalidated device data.  Last Pain:  Vitals:   07/13/21 0707  PainSc: 0-No pain         Complications: No notable events documented.

## 2021-07-13 NOTE — Anesthesia Preprocedure Evaluation (Signed)
Anesthesia Evaluation  Patient identified by MRN, date of birth, ID band Patient awake    Reviewed: Allergy & Precautions, H&P , NPO status , Patient's Chart, lab work & pertinent test results, reviewed documented beta blocker date and time   Airway Mallampati: III   Neck ROM: full    Dental  (+) Poor Dentition, Teeth Intact   Pulmonary pneumonia, resolved, Current Smoker and Patient abstained from smoking.,    Pulmonary exam normal        Cardiovascular Exercise Tolerance: Good hypertension, + dysrhythmias Atrial Fibrillation  Rhythm:regular Rate:Normal     Neuro/Psych negative neurological ROS  negative psych ROS   GI/Hepatic negative GI ROS, Neg liver ROS,   Endo/Other  negative endocrine ROS  Renal/GU negative Renal ROS  negative genitourinary   Musculoskeletal   Abdominal   Peds  Hematology negative hematology ROS (+)   Anesthesia Other Findings Past Medical History: No date: Dysrhythmia No date: Pneumonia Past Surgical History: 05/10/2021: COLONOSCOPY WITH PROPOFOL; N/A     Comment:  Procedure: COLONOSCOPY WITH PROPOFOL;  Surgeon: Lin Landsman, MD;  Location: [facility] ENDOSCOPY;  Service:               Endoscopy;  Laterality: N/A; 05/24/2021: COLONOSCOPY WITH PROPOFOL; N/A     Comment:  Procedure: COLONOSCOPY WITH PROPOFOL;  Surgeon: Lin Landsman, MD;  Location: [facility] ENDOSCOPY;  Service:               Gastroenterology;  Laterality: N/A; BMI    Body Mass Index: 42.82 kg/m     Reproductive/Obstetrics negative OB ROS                             Anesthesia Physical Anesthesia Plan  ASA: 4  Anesthesia Plan: General   Post-op Pain Management:    Induction:   PONV Risk Score and Plan:   Airway Management Planned:   Additional Equipment:   Intra-op Plan:   Post-operative Plan:   Informed Consent: I have reviewed the patients  History and Physical, chart, labs and discussed the procedure including the risks, benefits and alternatives for the proposed anesthesia with the patient or authorized representative who has indicated his/her understanding and acceptance.     Dental Advisory Given  Plan Discussed with: CRNA  Anesthesia Plan Comments:         Anesthesia Quick Evaluation

## 2021-07-13 NOTE — H&P (Signed)
[facility] Cardiology History and Physical  Patient ID: John Newman MRN: [id number] DOB/AGE: December 12, 1975 45 y.o. Admit date: 07/13/2021  Primary Care Physician: Sofie Hartigan, MD Primary Cardiologist Andrez Grime, MD   HPI:  John Newman is a 45 year old male with history of atrial fibrillation who has been experiencing shortness of breath as an outpatient.  He was discovered to have atrial fibrillation by his primary care doctor and has been compliant with his medications over the last months.  He presents today for scheduled cardioversion to restore normal sinus rhythm.    Past Medical History:  Diagnosis Date   Dysrhythmia    Pneumonia     Past Surgical History:  Procedure Laterality Date   COLONOSCOPY WITH PROPOFOL N/A 05/10/2021   Procedure: COLONOSCOPY WITH PROPOFOL;  Surgeon: Lin Landsman, MD;  Location: [facility] ENDOSCOPY;  Service: Endoscopy;  Laterality: N/A;   COLONOSCOPY WITH PROPOFOL N/A 05/24/2021   Procedure: COLONOSCOPY WITH PROPOFOL;  Surgeon: Lin Landsman, MD;  Location: [facility] ENDOSCOPY;  Service: Gastroenterology;  Laterality: N/A;    No medications prior to admission.   Social History   Socioeconomic History   Marital status: Single    Spouse name: Not on file   Number of children: Not on file   Years of education: Not on file   Highest education level: Not on file  Occupational History   Not on file  Tobacco Use   Smoking status: Every Day    Packs/day: 1.00    Types: Cigarettes   Smokeless tobacco: Never  Vaping Use   Vaping Use: Never used  Substance and Sexual Activity   Alcohol use: Yes    Alcohol/week: 10.0 standard drinks    Types: 10 Shots of liquor per week    Comment: 10 shots a day   Drug use: Not Currently    Types: Marijuana   Sexual activity: Not on file  Other Topics Concern   Not on file  Social History Narrative   Not on file   Social Determinants of Health   Financial Resource Strain: Not on file  Food  Insecurity: Not on file  Transportation Needs: Not on file  Physical Activity: Not on file  Stress: Not on file  Social Connections: Not on file  Intimate Partner Violence: Not on file    No family history on file.    Review of systems complete and found to be negative unless listed above      Physical Exam:  General: Well developed, well nourished, in no acute distress HEENT:  Normocephalic and atramatic Neck:  No JVD.  Lungs: Clear bilaterally to auscultation and percussion. Heart: Irregularly irregular. Normal S1 and S2 without gallops or murmurs.  Abdomen: Bowel sounds are positive, abdomen soft and non-tender  Msk:  Back normal, normal gait. Normal strength and tone for age. Extremities: No clubbing, cyanosis or edema.   Neuro: Alert and oriented X 3. Psych:  Good affect, responds appropriately   Labs:   Lab Results  Component Value Date   WBC 9.5 05/21/2021   HGB 13.3 05/21/2021   HCT 38.7 05/21/2021   MCV 95 05/21/2021   PLT 276 05/21/2021   No results for input(s): NA, K, CL, CO2, BUN, CREATININE, CALCIUM, PROT, BILITOT, ALKPHOS, ALT, AST, GLUCOSE in the last 168 hours.  Invalid input(s): LABALBU No results found for: CKTOTAL, CKMB, CKMBINDEX, TROPONINI No results found for: CHOL No results found for: HDL No results found for: LDLCALC No results found for: TRIG No results  found for: CHOLHDL No results found for: LDLDIRECT    Radiology: No results found.  EKG: AF  ASSESSMENT AND PLAN:  45 year old male with known history of A. fib who presents today for planned cardioversion.  He has been compliant with his Eliquis and has not missed any doses in the last 4 weeks.  The risks and benefits were discussed with the patient and he is agreeable to proceed with DCCV.  Signed: Andrez Grime MD 07/13/2021, 12:26 PM

## 2021-07-19 NOTE — Anesthesia Postprocedure Evaluation (Signed)
Anesthesia Post Note  Patient: John Newman  Procedure(s) Performed: CARDIOVERSION  Patient location during evaluation: PACU Anesthesia Type: General Level of consciousness: awake and alert Pain management: pain level controlled Vital Signs Assessment: post-procedure vital signs reviewed and stable Respiratory status: spontaneous breathing, nonlabored ventilation, respiratory function stable and patient connected to nasal cannula oxygen Cardiovascular status: blood pressure returned to baseline and stable Postop Assessment: no apparent nausea or vomiting Anesthetic complications: no   No notable events documented.   Last Vitals:  Vitals:   07/13/21 0815 07/13/21 0830  BP: (!) 140/91 (!) 143/81  Pulse: 78 74  Resp: 18 18  SpO2: 96% 99%    Last Pain:  Vitals:   07/13/21 0707  PainSc: 0-No pain                 Molli Barrows

## 2021-10-22 IMAGING — US US ABDOMEN LIMITED
1 series · 14 of 25 positions shown · non-contrast
Comparison: No prior.

CLINICAL DATA: Hepatomegaly.

EXAM:
ULTRASOUND ABDOMEN LIMITED RIGHT UPPER QUADRANT

[Series 1: us abdomen limited · 0.23mm/px · 14 of 49 slices shown]
[im 1/49]
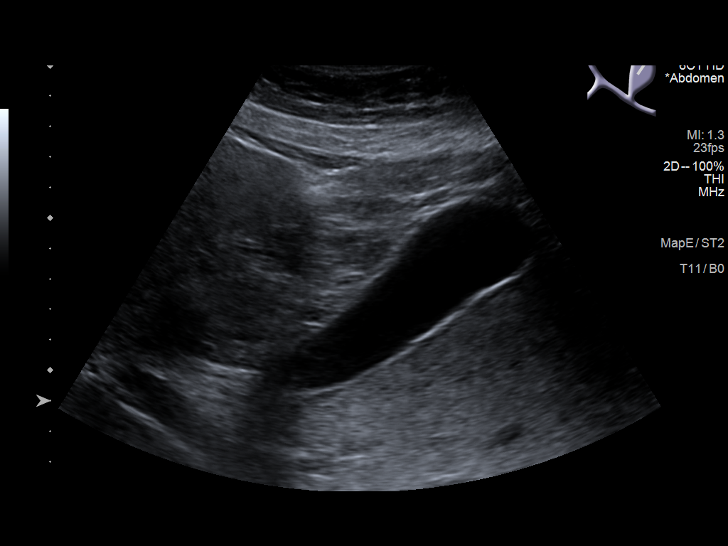
[im 5/49]
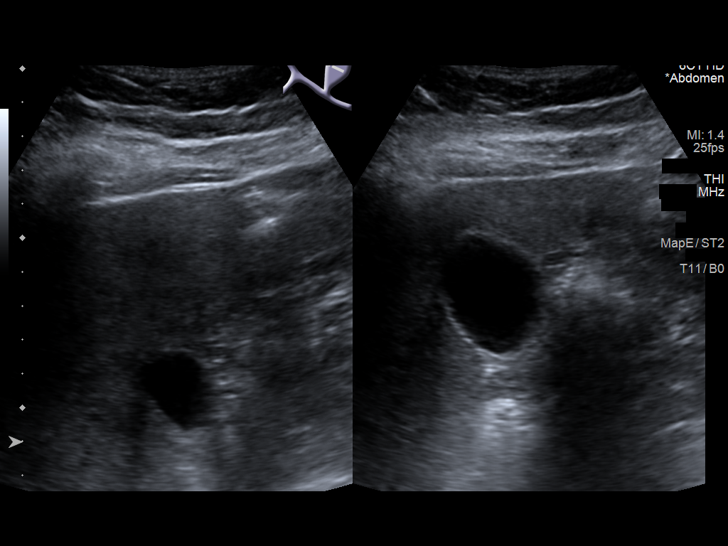
[im 9/49]
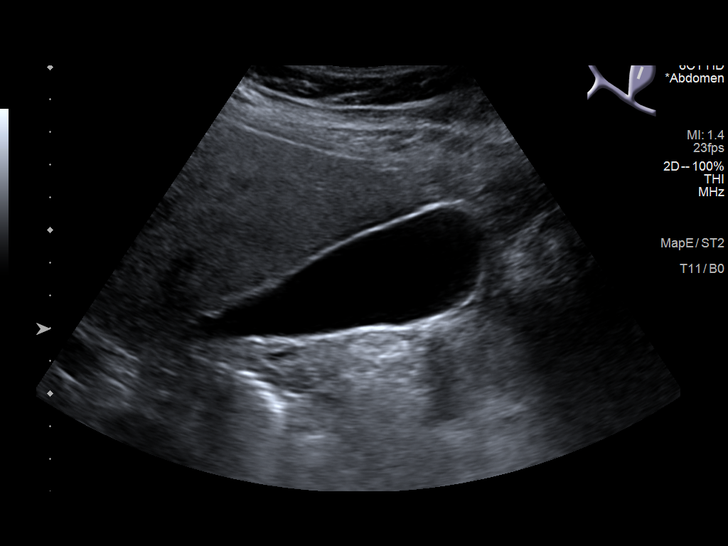
[im 13/49]
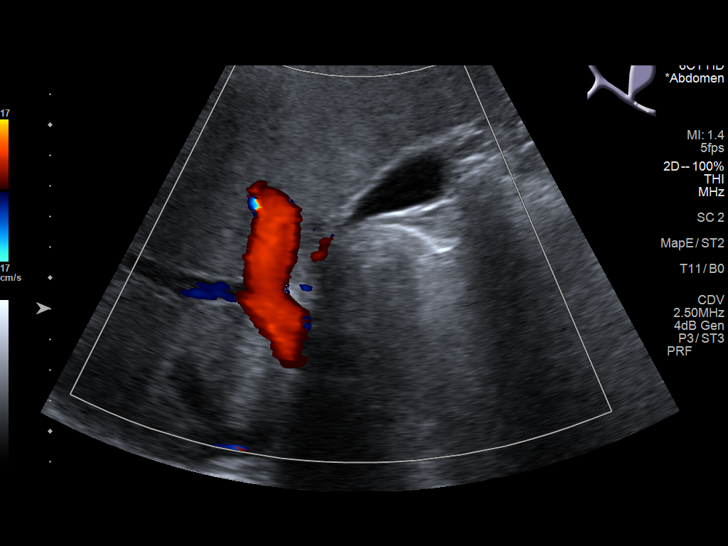
[im 17/49]
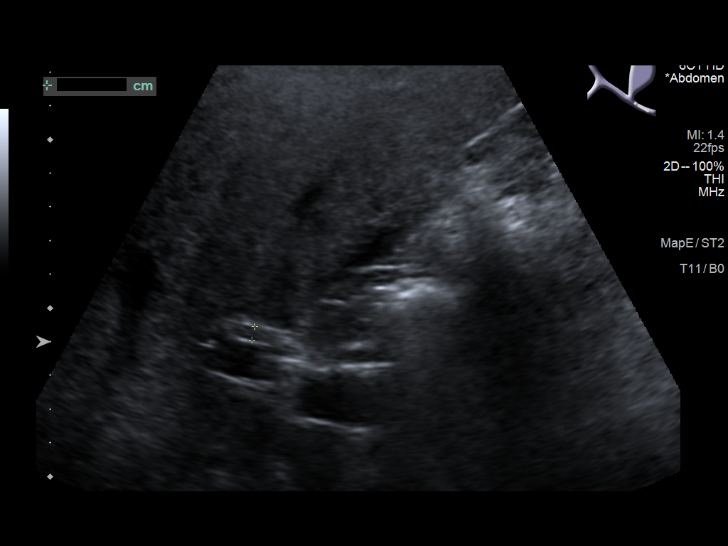
[im 19/49]
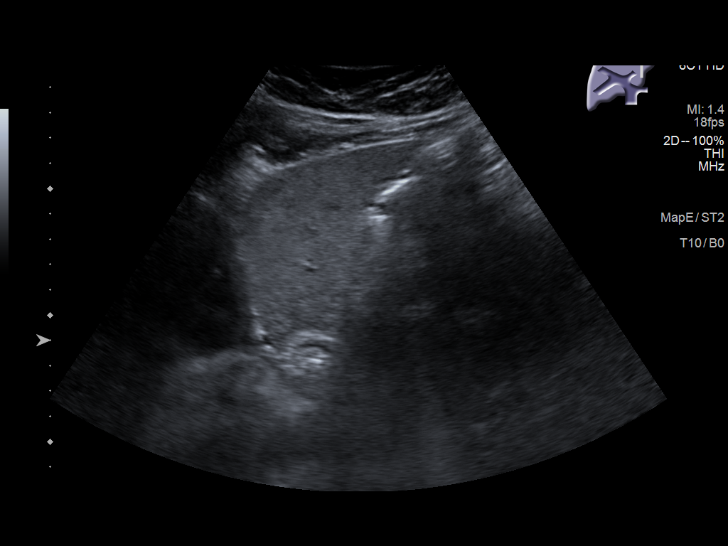
[im 23/49]
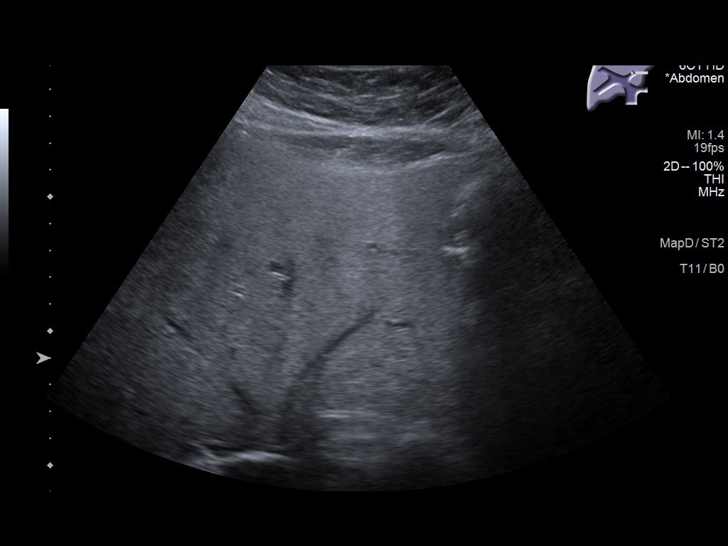
[im 27/49]
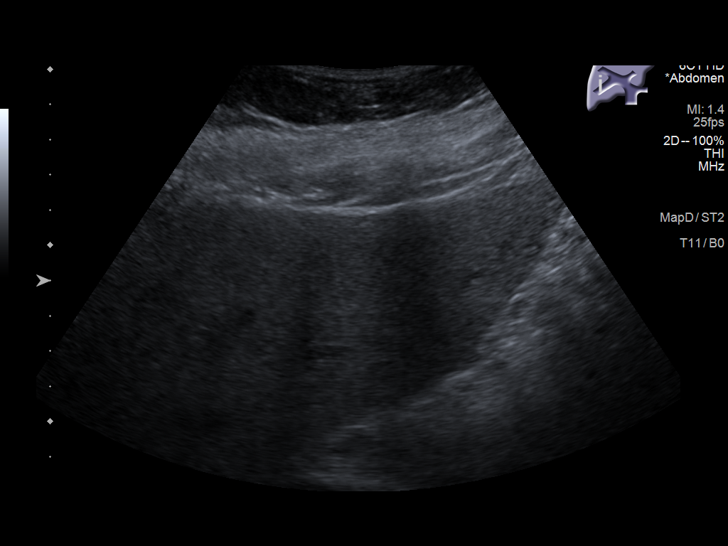
[im 31/49]
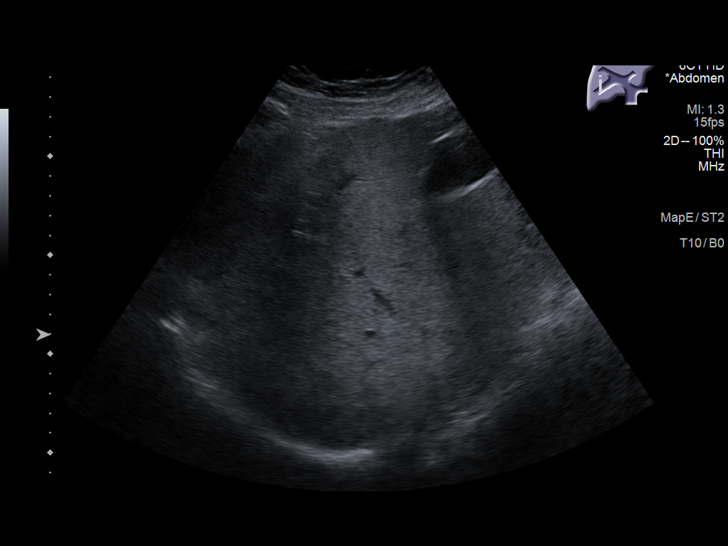
[im 33/49]
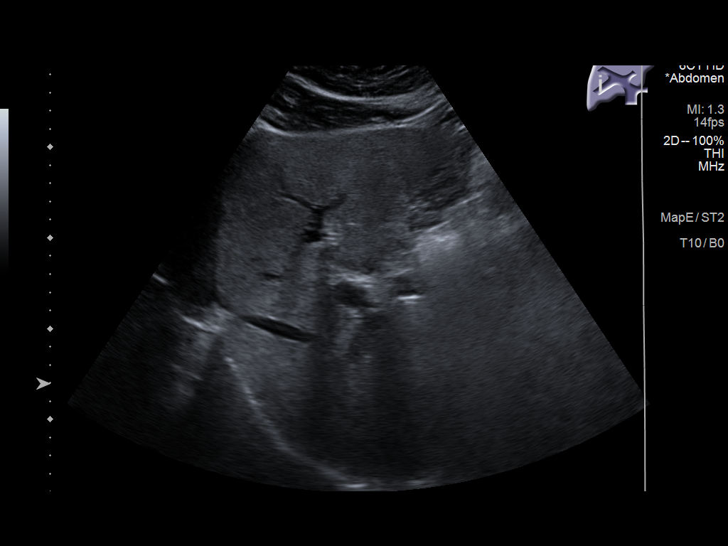
[im 37/49]
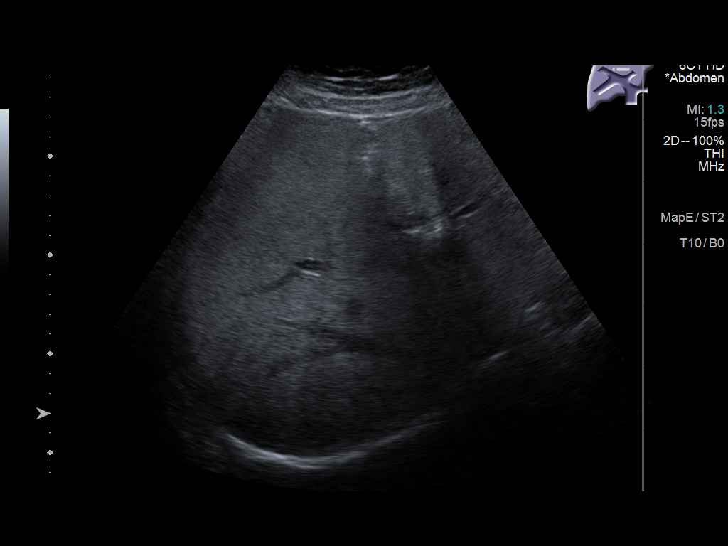
[im 41/49]
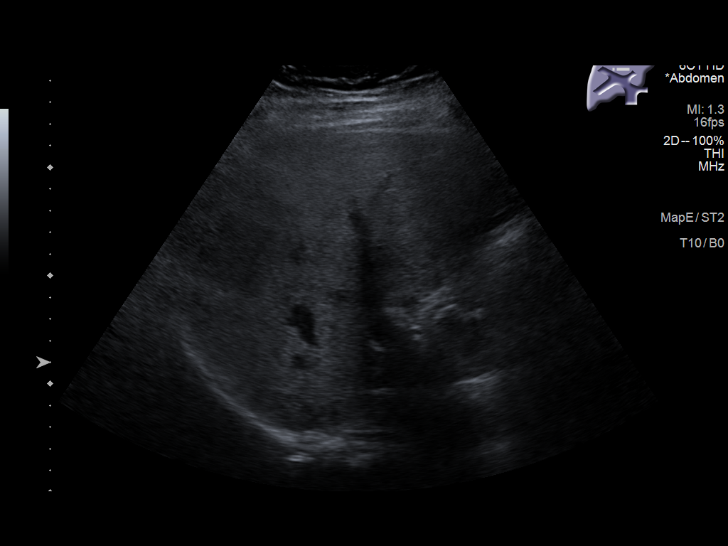
[im 45/49]
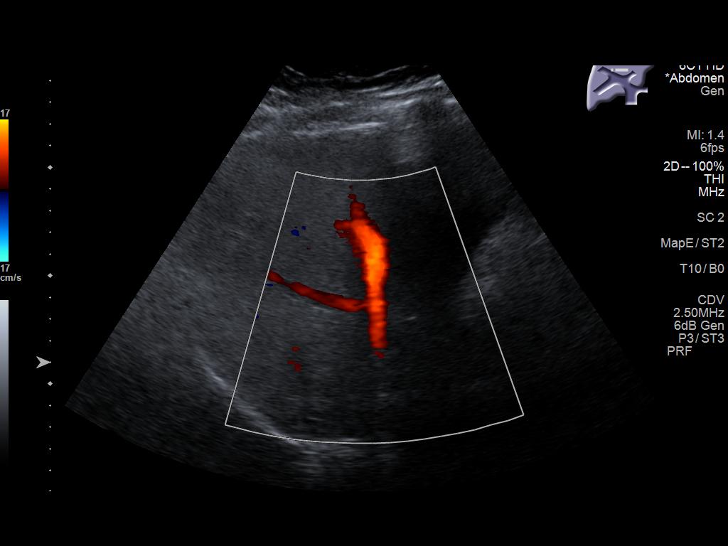
[im 49/49]
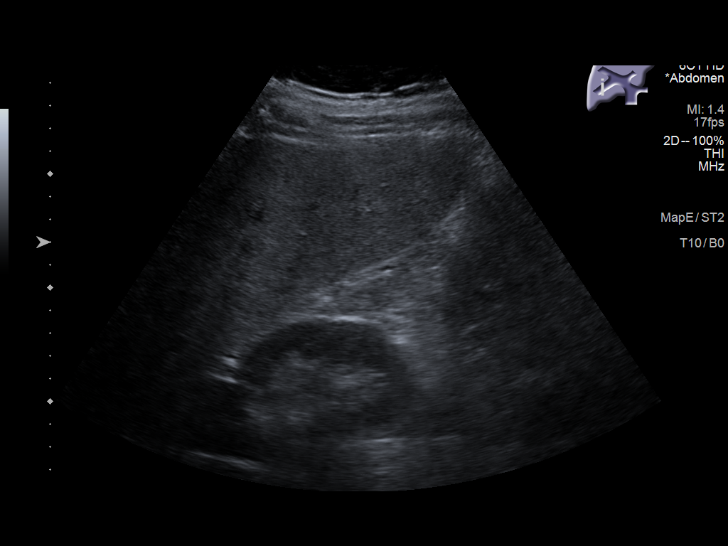

[14 of 25 positions shown; findings below may reference images not displayed]

FINDINGS: Gallbladder:

No gallstones or wall thickening visualized. No sonographic Murphy
sign noted by sonographer.

Common bile duct:

Diameter: 4.1 mm

Liver:

Increased hepatic echogenicity consistent fatty infiltration or
hepatocellular disease. Portal vein is patent on color Doppler
imaging with normal direction of blood flow towards the liver.

Other: None.
IMPRESSION: 1.  No gallstones or biliary distention.

2. Increased hepatic echogenicity consistent fatty infiltration or
hepatocellular disease.

## 2021-11-09 IMAGING — US US LIVER ELASTOGRAPHY
1 series · 12 of 25 positions shown · non-contrast
Comparison: 03/20/2020

CLINICAL DATA: Abnormal liver ultrasound showing echogenic liver.

EXAM:
US LIVER ELASTOGRAPHY
TECHNIQUE: Sonography of the liver was performed. In addition, ultrasound
elastography evaluation of the liver was performed. A region of
interest was placed within the right lobe of the liver. Following
application of a compressive sonographic pulse, tissue
compressibility was assessed. Multiple assessments were performed at
the selected site. Median tissue compressibility was determined.
Previously, hepatic stiffness was assessed by shear wave velocity.
Based on recently published [employer] in [employer]trasound
consensus article, reporting is now recommended to be performed in
the SI units of pressure (kiloPascals) representing hepatic
stiffness/elasticity. The obtained result is compared to the
published reference standards. (cACLD = compensated Advanced Chronic
Liver Disease)

[Series 1: us elastography liver · 12 of 44 slices shown]
[im 2/44]
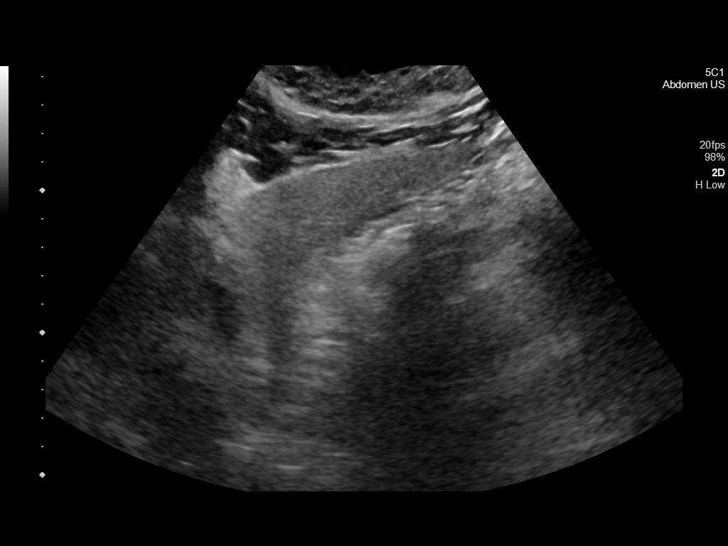
[im 6/44]
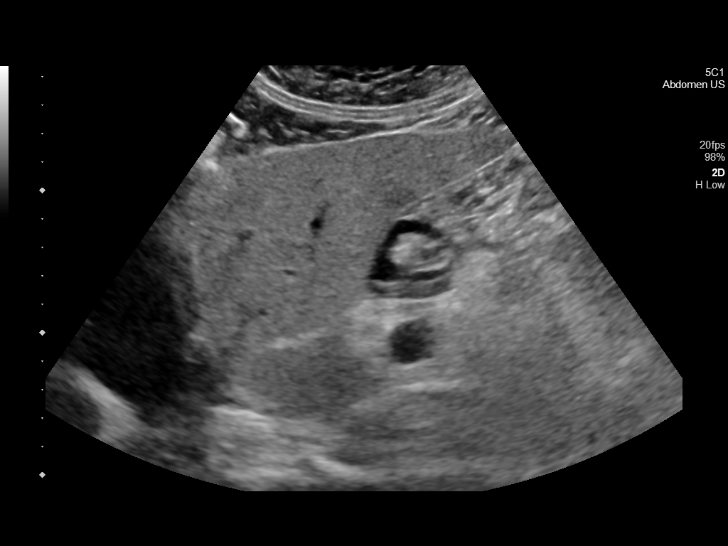
[im 9/44]
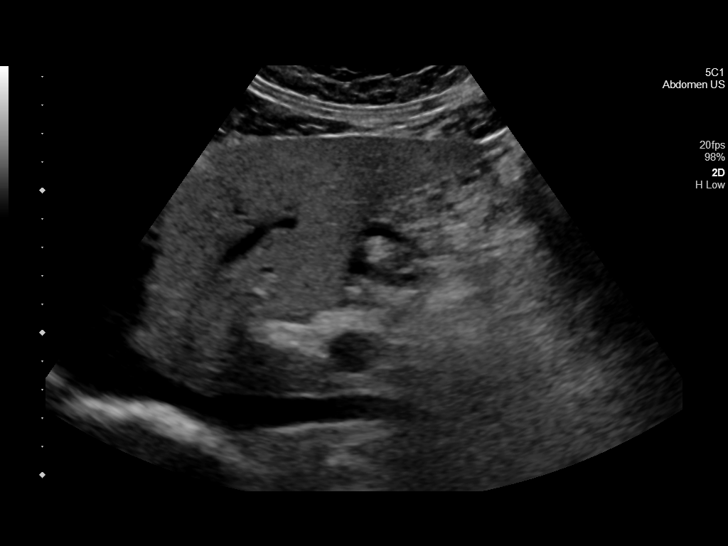
[im 13/44]
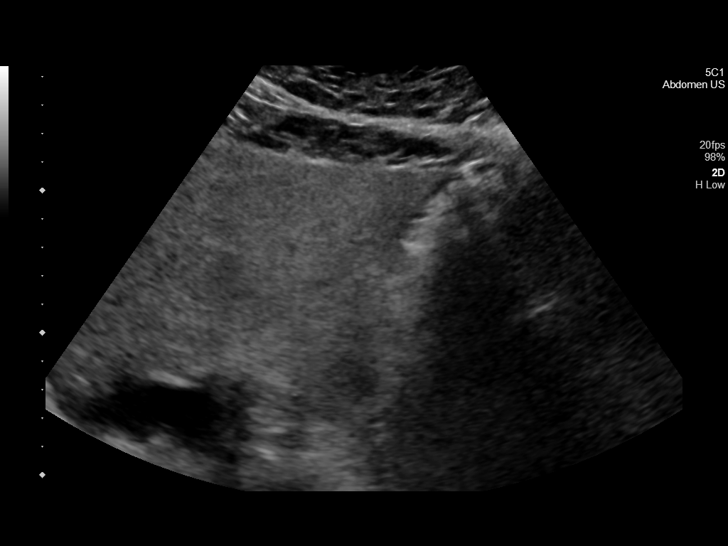
[im 17/44]
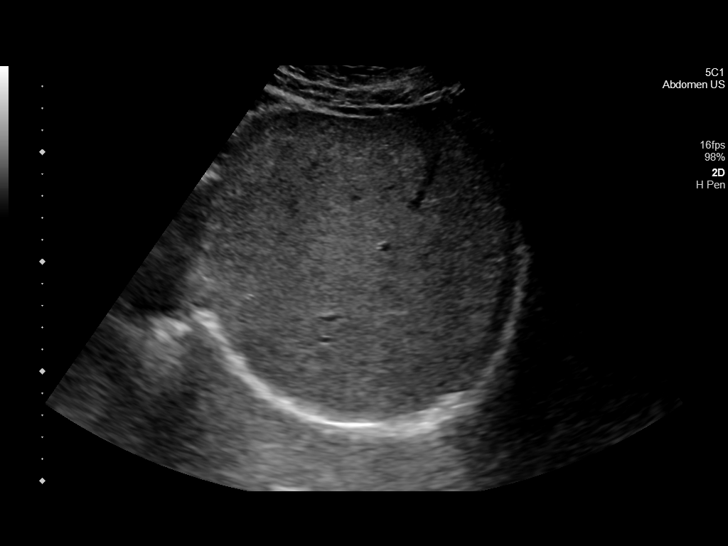
[im 20/44]
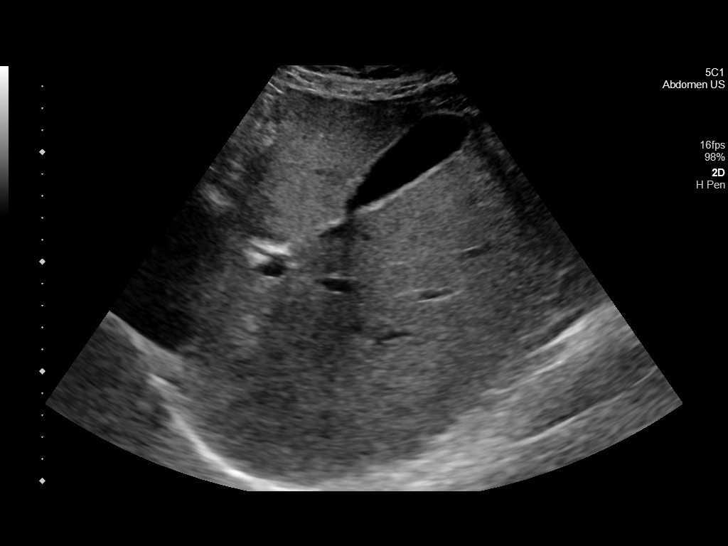
[im 24/44]
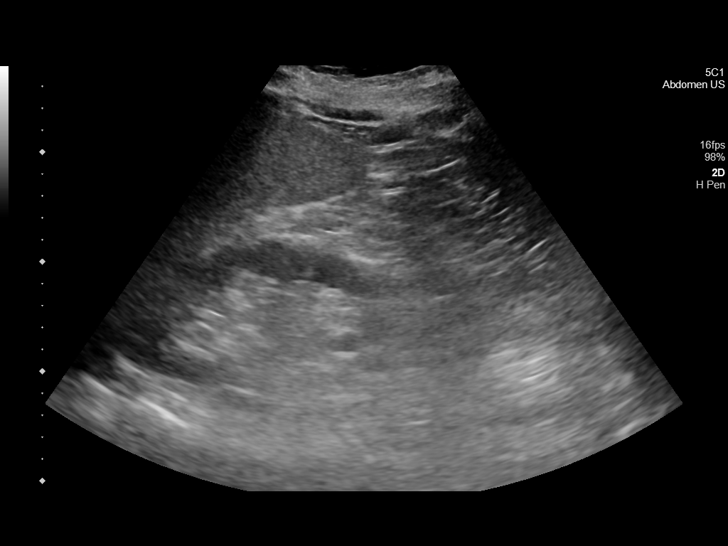
[im 27/44]
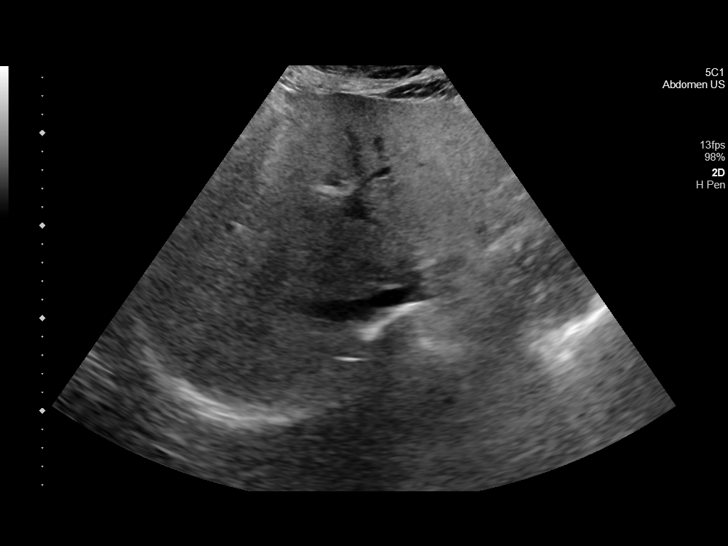
[im 31/44]
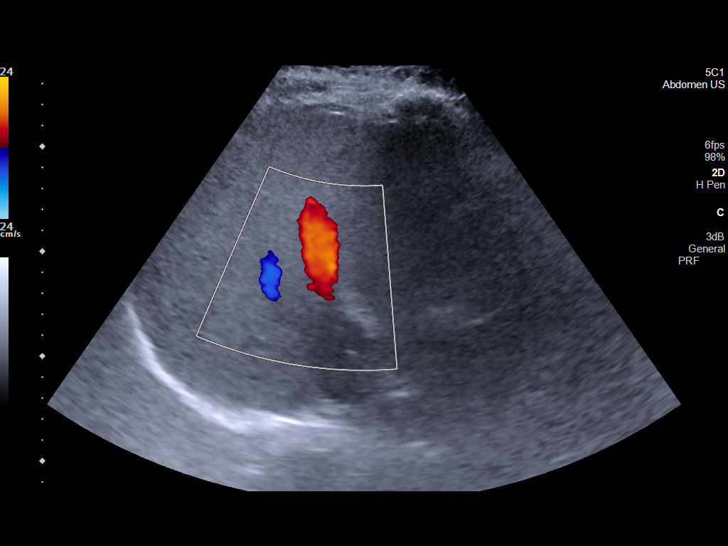
[im 35/44]
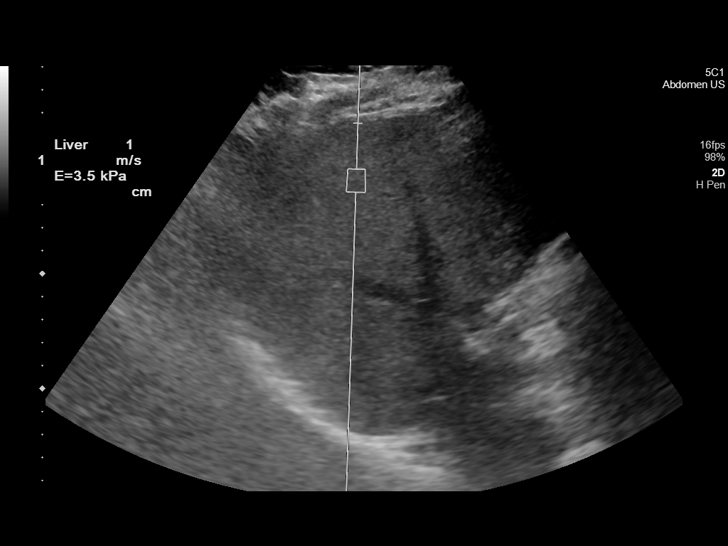
[im 38/44]
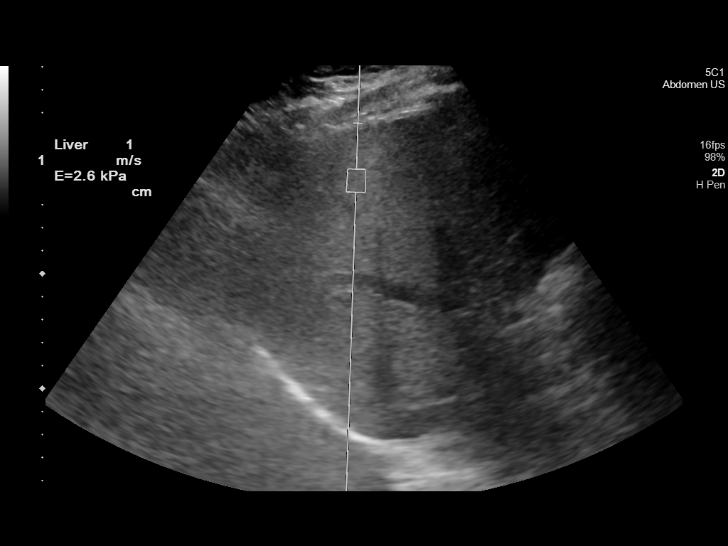
[im 42/44]
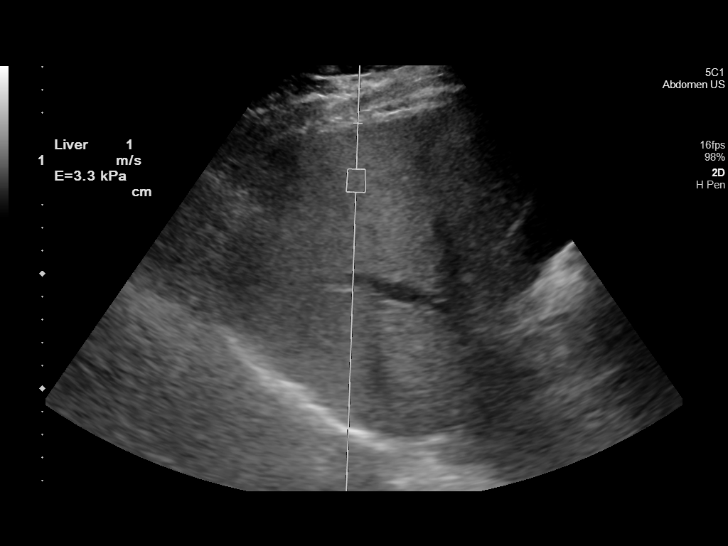

[12 of 25 positions shown; findings below may reference images not displayed]

FINDINGS: Liver: Increased parenchymal echogenicity. No focal liver
abnormality. Portal vein is patent on color Doppler imaging with
normal direction of blood flow towards the liver.

ULTRASOUND HEPATIC ELASTOGRAPHY

Device: Siemens Helix VTQ

Patient position: Supine

Transducer: [id number]

Number of measurements: 10

Hepatic segment:  8

Median kPa:

IQR:

IQR/Median kPa ratio:

Data quality:  Good

Diagnostic category:  < or = 5 kPa: high probability of being normal

The use of hepatic elastography is applicable to patients with viral
hepatitis and non-alcoholic fatty liver disease. At this time, there
is insufficient data for the referenced cut-off values and use in
other causes of liver disease, including alcoholic liver disease.
Patients, however, may be assessed by elastography and serve as
their own reference standard/baseline.

In patients with non-alcoholic liver disease, the values suggesting
compensated advanced chronic liver disease (cACLD) may be lower, and
patients may need additional testing with elasticity results of [DATE]
kPa.

Please note that abnormal hepatic elasticity and shear wave
velocities may also be identified in clinical settings other than
with hepatic fibrosis, such as: acute hepatitis, elevated right
heart and central venous pressures including use of beta blockers,
Puyol disease ([place]), infiltrative processes such as
mastocytosis/amyloidosis/infiltrative tumor/lymphoma, extrahepatic
cholestasis, with hyperemia in the post-prandial state, and with
liver transplantation. Correlation with patient history, laboratory
data, and clinical condition recommended.

Diagnostic Categories:

< or =5 kPa: high probability of being normal

< or =9 kPa: in the absence of other known clinical signs, rules [DATE] kPa and ?13 kPa: suggestive of cACLD, but needs further testing

>13 kPa: highly suggestive of cACLD

> or =17 kPa: highly suggestive of cACLD with an increased
probability of clinically significant portal hypertension
IMPRESSION: 1. ULTRASOUND LIVER: Increased parenchymal echogenicity.
2. ULTRASOUND HEPATIC ELASTOGRAPHY: Median kPa: 3.2. Diagnostic
category: < or = 5 kPa: high probability of being normal. The use of
hepatic elastography is applicable to patients with viral hepatitis
and non-alcoholic fatty liver disease. At this time, there is
insufficient data for the referenced cut-off values and use in other
causes of liver disease, including alcoholic liver disease.
Patients, however, may be assessed by elastography and serve as
their own reference standard/baseline.
